# Patient Record
Sex: Female | Born: 1985 | Race: White | Hispanic: No | Marital: Single | Smoking: Current every day smoker
Health system: Southern US, Community
[De-identification: ages and names within clinical notes are randomized; demographics above are authoritative.]

## PROBLEM LIST (undated history)

## (undated) DIAGNOSIS — R569 Unspecified convulsions: Secondary | ICD-10-CM

## (undated) HISTORY — PX: CYSTECTOMY: SUR359

---

## 2015-12-17 ENCOUNTER — Emergency Department (INDEPENDENT_AMBULATORY_CARE_PROVIDER_SITE_OTHER): Payer: BLUE CROSS/BLUE SHIELD

## 2015-12-17 ENCOUNTER — Encounter: Payer: Self-pay | Admitting: *Deleted

## 2015-12-17 ENCOUNTER — Emergency Department (INDEPENDENT_AMBULATORY_CARE_PROVIDER_SITE_OTHER)
Admission: EM | Admit: 2015-12-17 | Discharge: 2015-12-17 | Disposition: A | Discharge status: Home / Self Care | Payer: BLUE CROSS/BLUE SHIELD | Source: Home / Self Care | Attending: Family Medicine | Admitting: Family Medicine

## 2015-12-17 DIAGNOSIS — M533 Sacrococcygeal disorders, not elsewhere classified: Secondary | ICD-10-CM

## 2015-12-17 DIAGNOSIS — M545 Low back pain: Secondary | ICD-10-CM | POA: Diagnosis not present

## 2015-12-17 HISTORY — DX: Unspecified convulsions: R56.9

## 2015-12-17 MED ORDER — ACETAMINOPHEN 325 MG PO TABS
650.0000 mg | ORAL_TABLET | Freq: Once | ORAL | Status: AC
  Administered 2015-12-17: 650 mg via ORAL

## 2015-12-17 MED ORDER — HYDROCODONE-ACETAMINOPHEN 5-325 MG PO TABS: 1.0000 | ORAL_TABLET | Freq: Four times a day (QID) | ORAL | Status: AC | PRN

## 2015-12-17 MED ORDER — IBUPROFEN 600 MG PO TABS: 600.0000 mg | ORAL_TABLET | Freq: Four times a day (QID) | ORAL | Status: AC | PRN
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
## 2015-12-17 NOTE — Discharge Instructions (Signed)
Norco/Vicodin (hydrocodone-acetaminophen) is a narcotic pain medication, do not combine these medications with others containing tylenol. While taking, do not drink alcohol, drive, or perform any other activities that requires focus while taking these medications.   This type of pain medication may also cause constipation, which could worsen your pain when having a bowel movement due to the location.  Only take as needed for severe pain. You may take a stool softener while taking the pain medication, such as over the counter Miralax.    Tailbone Injury The tailbone (coccyx) is the small bone at the lower end of the spine. A tailbone injury may involve stretched ligaments, bruising, or a broken bone (fracture). Tailbone injuries can be painful, and some may take a long time to heal. CAUSES This condition is often caused by falling and landing on the tailbone. Other causes include:  Repeated strain or friction from actions such as rowing and bicycling.  Childbirth. In some cases, the cause may not be known. RISK FACTORS This condition is more common in women than in men. SYMPTOMS Symptoms of this condition include:  Pain in the lower back, especially when sitting.  Pain or difficulty when standing up from a sitting position.  Bruising in the tailbone area.  Painful bowel movements.  In women, pain during intercourse. DIAGNOSIS This condition may be diagnosed based on your symptoms and a physical exam. X-rays may be taken if a fracture is suspected. You may also have other tests, such as a CT scan or MRI. TREATMENT This condition may be treated with medicines to help relieve your pain. Most tailbone injuries heal on their own in 4-6 weeks. However, recovery time may be longer if the injury involves a fracture. HOME CARE INSTRUCTIONS  Take medicines only as directed by your health care provider.  If directed, apply ice to the injured area:  Put ice in a plastic bag.  Place a towel  between your skin and the bag.  Leave the ice on for 20 minutes, 2-3 times per day for the first 1-2 days.  Sit on a large, rubber or inflated ring or cushion to ease your pain. Lean forward when you are sitting to help decrease discomfort.  Avoid sitting for long periods of time.  Increase your activity as the pain allows. Perform any exercises that are recommended by your health care provider or physical therapist.  If you have pain during bowel movements, use stool softeners as directed by your health care provider.  Eat a diet that includes plenty of fiber to help prevent constipation.  Keep all follow-up visits as directed by your health care provider. This is important. PREVENTION Wear appropriate padding and sports gear when bicycling and rowing. This can help to prevent developing an injury that is caused by repeated strain or friction. SEEK MEDICAL CARE IF:  Your pain becomes worse.  Your bowel movements cause a great deal of discomfort.  You are unable to have a bowel movement.  You have uncontrolled urine loss (urinary incontinence).  You have a fever.   This information is not intended to replace advice given to you by your health care provider. Make sure you discuss any questions you have with your health care provider.   Document Released: 07/03/2000 Document Revised: 11/20/2014 Document Reviewed: 07/02/2014 Elsevier Interactive Patient Education [employer]
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
